# Patient Record
Sex: Female | Born: 1970 | Race: Asian | Hispanic: No | Smoking: Former smoker
Health system: Southern US, Community
[De-identification: ages and names within clinical notes are randomized; demographics above are authoritative.]

## PROBLEM LIST (undated history)

## (undated) DIAGNOSIS — M51369 Other intervertebral disc degeneration, lumbar region without mention of lumbar back pain or lower extremity pain: Secondary | ICD-10-CM

## (undated) DIAGNOSIS — I10 Essential (primary) hypertension: Secondary | ICD-10-CM

## (undated) DIAGNOSIS — M5136 Other intervertebral disc degeneration, lumbar region: Secondary | ICD-10-CM

## (undated) HISTORY — PX: ORIF TIBIA & FIBULA FRACTURES: SHX2131

## (undated) HISTORY — PX: WISDOM TOOTH EXTRACTION: SHX21

## (undated) HISTORY — PX: SHOULDER SURGERY: SHX246

## (undated) HISTORY — PX: BACK SURGERY: SHX140

---

## 2016-06-17 ENCOUNTER — Emergency Department (HOSPITAL_COMMUNITY): Payer: BLUE CROSS/BLUE SHIELD

## 2016-06-17 ENCOUNTER — Encounter (HOSPITAL_COMMUNITY): Payer: Self-pay | Admitting: Vascular Surgery

## 2016-06-17 ENCOUNTER — Emergency Department (HOSPITAL_COMMUNITY)
Admission: EM | Admit: 2016-06-17 | Discharge: 2016-06-18 | Disposition: A | Discharge status: Home / Self Care | Payer: BLUE CROSS/BLUE SHIELD | Attending: Emergency Medicine | Admitting: Emergency Medicine

## 2016-06-17 DIAGNOSIS — Z87891 Personal history of nicotine dependence: Secondary | ICD-10-CM | POA: Insufficient documentation

## 2016-06-17 DIAGNOSIS — M25532 Pain in left wrist: Secondary | ICD-10-CM | POA: Insufficient documentation

## 2016-06-17 DIAGNOSIS — M79632 Pain in left forearm: Secondary | ICD-10-CM | POA: Diagnosis not present

## 2016-06-17 DIAGNOSIS — M79602 Pain in left arm: Secondary | ICD-10-CM

## 2016-06-17 DIAGNOSIS — R52 Pain, unspecified: Secondary | ICD-10-CM

## 2016-06-17 DIAGNOSIS — I10 Essential (primary) hypertension: Secondary | ICD-10-CM | POA: Diagnosis not present

## 2016-06-17 HISTORY — DX: Essential (primary) hypertension: I10

## 2016-06-17 HISTORY — DX: Other intervertebral disc degeneration, lumbar region: M51.36

## 2016-06-17 HISTORY — DX: Other intervertebral disc degeneration, lumbar region without mention of lumbar back pain or lower extremity pain: M51.369

## 2016-06-17 NOTE — Discharge Instructions (Signed)
Follow-up with hand surgery if you're pain persists. Return for worsening swelling, redness, fever or for any concerns.  Musculoskeletal Pain Musculoskeletal pain is muscle and boney aches and pains. These pains can occur in any part of the body. Your caregiver may treat you without knowing the cause of the pain. They may treat you if blood or urine tests, X-rays, and other tests were normal.  CAUSES There is often not a definite cause or reason for these pains. These pains may be caused by a type of germ (virus). The discomfort may also come from overuse. Overuse includes working out too hard when your body is not fit. Boney aches also come from weather changes. Bone is sensitive to atmospheric pressure changes. HOME CARE INSTRUCTIONS   Ask when your test results will be ready. Make sure you get your test results.  Only take over-the-counter or prescription medicines for pain, discomfort, or fever as directed by your caregiver. If you were given medications for your condition, do not drive, operate machinery or power tools, or sign legal documents for 24 hours. Do not drink alcohol. Do not take sleeping pills or other medications that may interfere with treatment.  Continue all activities unless the activities cause more pain. When the pain lessens, slowly resume normal activities. Gradually increase the intensity and duration of the activities or exercise.  During periods of severe pain, bed rest may be helpful. Lay or sit in any position that is comfortable.  Putting ice on the injured area.  Put ice in a bag.  Place a towel between your skin and the bag.  Leave the ice on for 15 to 20 minutes, 3 to 4 times a day.  Follow up with your caregiver for continued problems and no reason can be found for the pain. If the pain becomes worse or does not go away, it may be necessary to repeat tests or do additional testing. Your caregiver may need to look further for a possible cause. SEEK  IMMEDIATE MEDICAL CARE IF:  You have pain that is getting worse and is not relieved by medications.  You develop chest pain that is associated with shortness or breath, sweating, feeling sick to your stomach (nauseous), or throw up (vomit).  Your pain becomes localized to the abdomen.  You develop any new symptoms that seem different or that concern you. MAKE SURE YOU:   Understand these instructions.  Will watch your condition.  Will get help right away if you are not doing well or get worse.   This information is not intended to replace advice given to you by your health care provider. Make sure you discuss any questions you have with your health care provider.   Document Released: 11/17/2005 Document Revised: 02/09/2012 Document Reviewed: 07/22/2013 Elsevier Interactive Patient Education [employer]
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
## 2016-06-17 NOTE — ED Notes (Addendum)
Pt verbalized understanding of d/c instructions and has no further questions. Pt educated to come back to the admitting Dept in the morning for venous duplex study of left arm. Pt stable NAD. Pt d/c home with friend driving.

## 2016-06-18 ENCOUNTER — Ambulatory Visit (HOSPITAL_COMMUNITY): Admission: RE | Admit: 2016-06-18 | Payer: BLUE CROSS/BLUE SHIELD | Source: Ambulatory Visit

## 2016-06-24 ENCOUNTER — Ambulatory Visit (HOSPITAL_COMMUNITY)
Admission: RE | Admit: 2016-06-24 | Discharge: 2016-06-24 | Disposition: A | Discharge status: Home / Self Care | Payer: BLUE CROSS/BLUE SHIELD | Source: Ambulatory Visit | Attending: Emergency Medicine | Admitting: Emergency Medicine

## 2016-06-24 DIAGNOSIS — M79609 Pain in unspecified limb: Secondary | ICD-10-CM

## 2016-06-24 DIAGNOSIS — M79602 Pain in left arm: Secondary | ICD-10-CM | POA: Insufficient documentation

## 2016-06-24 NOTE — Progress Notes (Signed)
*  PRELIMINARY RESULTS* Vascular Ultrasound Left upper extremity venous duplex has been completed.  Preliminary findings: No evidence of DVT or superficial thrombosis.   Farrel Demark, RDMS, RVT  06/24/2016, 9:16 AM

## 2018-03-17 IMAGING — DX DG FOREARM 2V*L*
2 series · 2 of 2 positions shown · non-contrast
Comparison: None.

CLINICAL DATA: Left wrist pain for 3-4 days.  No known injury.

EXAM:
LEFT FOREARM - 2 VIEW

[forearm ap]
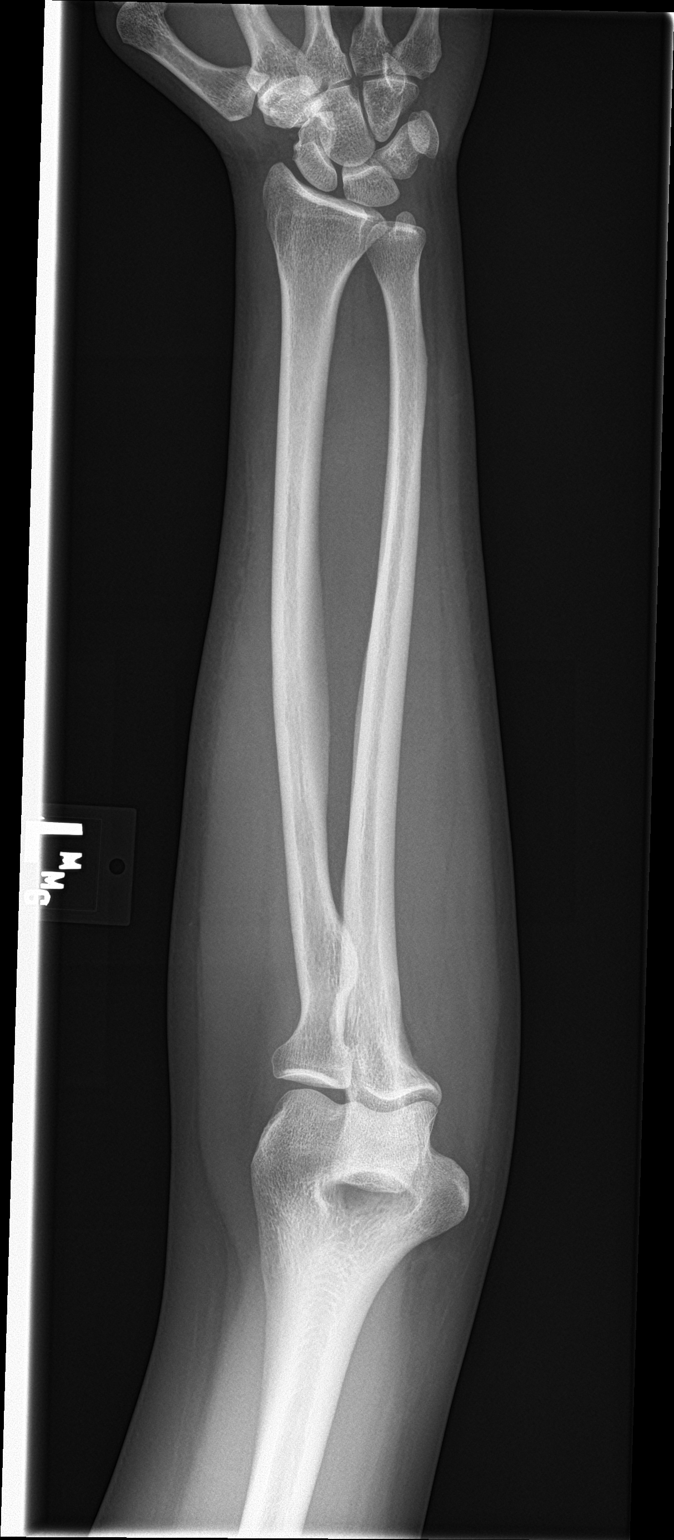

[forearm lat]
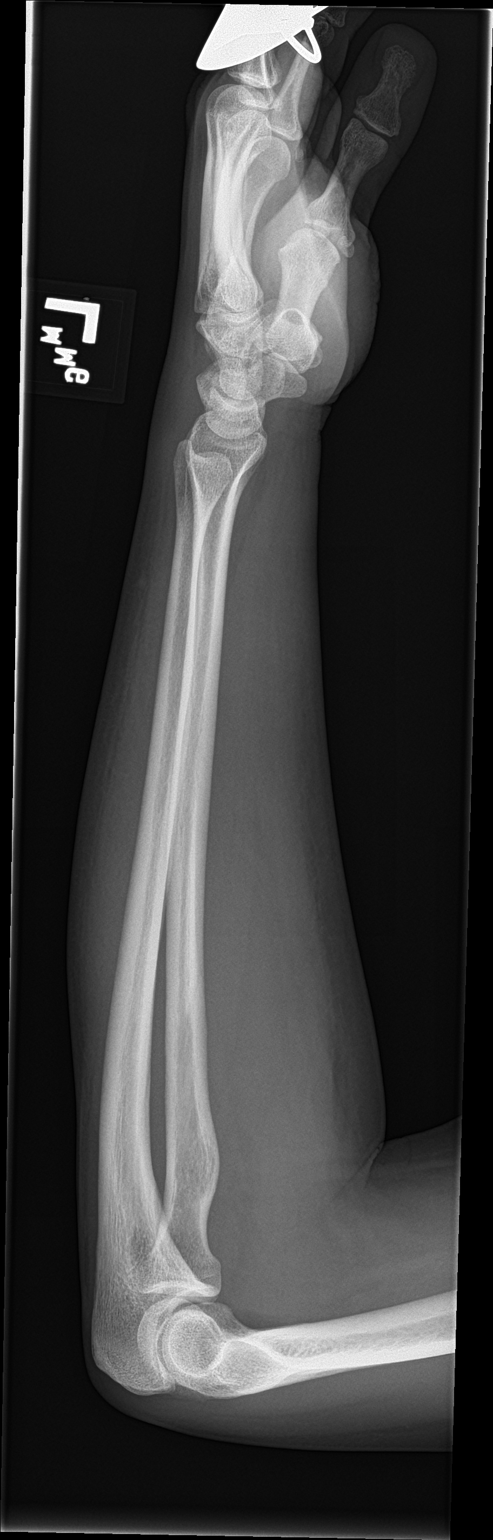

[2 of 2 positions shown; findings below may reference images not displayed]

FINDINGS: There is no evidence of fracture or other focal bone lesions. Soft
tissues are unremarkable.
IMPRESSION: Negative.
